# Patient Record
Sex: Male | Born: 1940 | Race: White | Hispanic: No | Marital: Married
Health system: Southern US, Community
[De-identification: ages and names within clinical notes are randomized; demographics above are authoritative.]

---

## 2009-12-30 ENCOUNTER — Ambulatory Visit: Payer: Self-pay | Admitting: Urology

## 2010-01-28 ENCOUNTER — Ambulatory Visit: Payer: Self-pay | Admitting: Radiation Oncology

## 2010-02-18 ENCOUNTER — Ambulatory Visit: Payer: Self-pay | Admitting: Radiation Oncology

## 2010-02-21 ENCOUNTER — Ambulatory Visit: Payer: Self-pay | Admitting: Radiation Oncology

## 2010-02-25 ENCOUNTER — Ambulatory Visit: Payer: Self-pay | Admitting: Radiation Oncology

## 2010-03-11 ENCOUNTER — Ambulatory Visit: Payer: Self-pay | Admitting: Radiation Oncology

## 2010-03-28 ENCOUNTER — Ambulatory Visit: Payer: Self-pay | Admitting: Radiation Oncology

## 2010-04-27 ENCOUNTER — Ambulatory Visit: Payer: Self-pay | Admitting: Radiation Oncology

## 2010-06-27 ENCOUNTER — Ambulatory Visit: Payer: Self-pay | Admitting: Radiation Oncology

## 2010-07-16 ENCOUNTER — Ambulatory Visit: Payer: Self-pay | Admitting: Radiation Oncology

## 2010-07-28 ENCOUNTER — Ambulatory Visit: Payer: Self-pay | Admitting: Radiation Oncology

## 2011-01-14 ENCOUNTER — Ambulatory Visit: Payer: Self-pay | Admitting: Radiation Oncology

## 2011-01-15 LAB — PSA

## 2011-01-28 ENCOUNTER — Ambulatory Visit: Payer: Self-pay | Admitting: Radiation Oncology

## 2011-09-17 IMAGING — CT CT SIM MISC
1 series · 16 of 32 positions shown, 20 images · non-contrast
Comparison: none

[Series 2: — · axial · 0.49mm/px · z∈[-1140,-1044]mm · 16 of 54 slices shown, 20 images]
[im 4/54  soft-tissue]
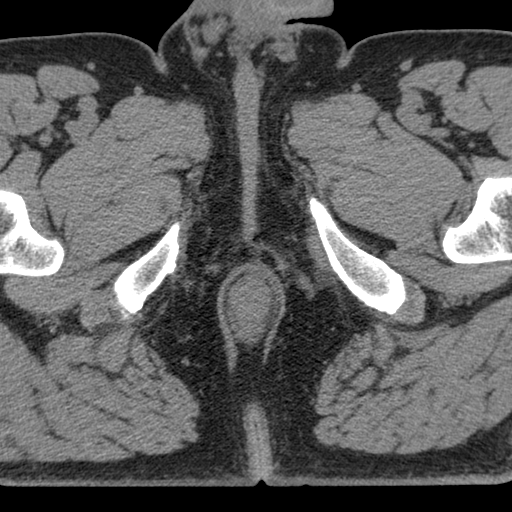
[im 4/54  bone]
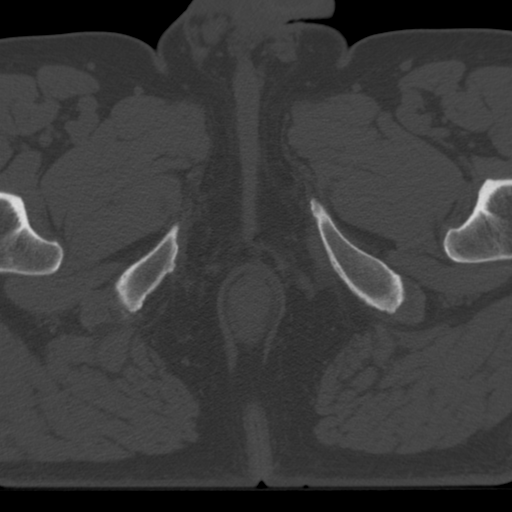
[im 7/54  soft-tissue]
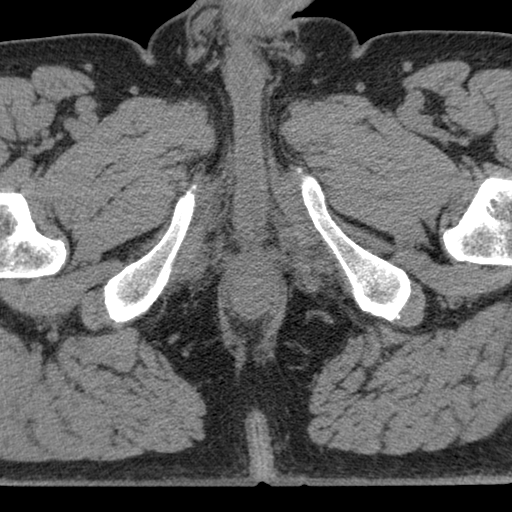
[im 11/54  soft-tissue]
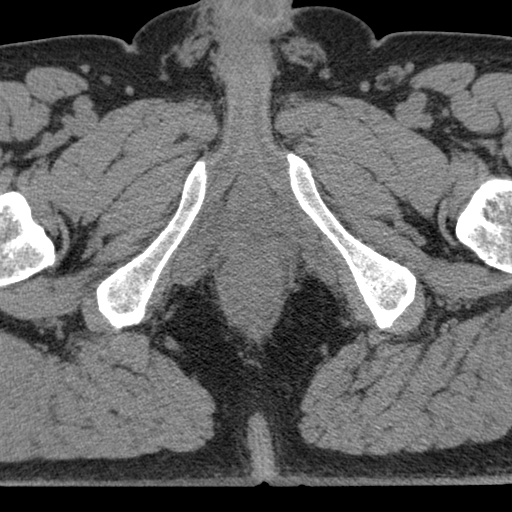
[im 14/54  soft-tissue]
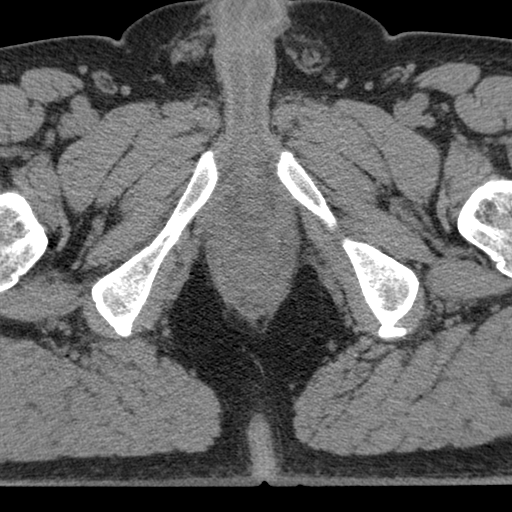
[im 18/54  soft-tissue]
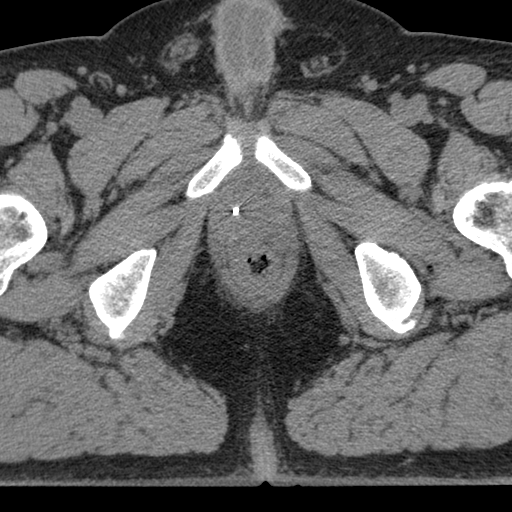
[im 21/54  soft-tissue]
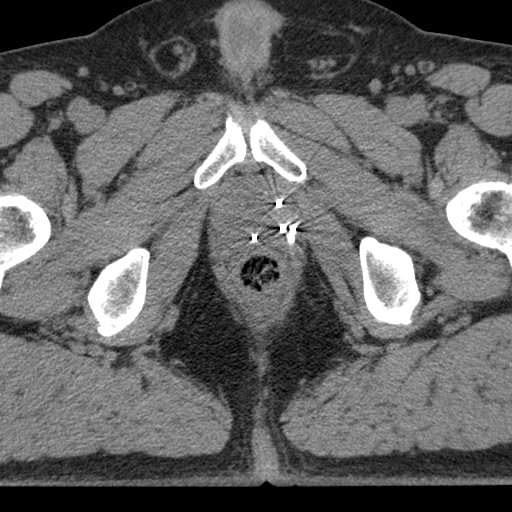
[im 24/54  soft-tissue]
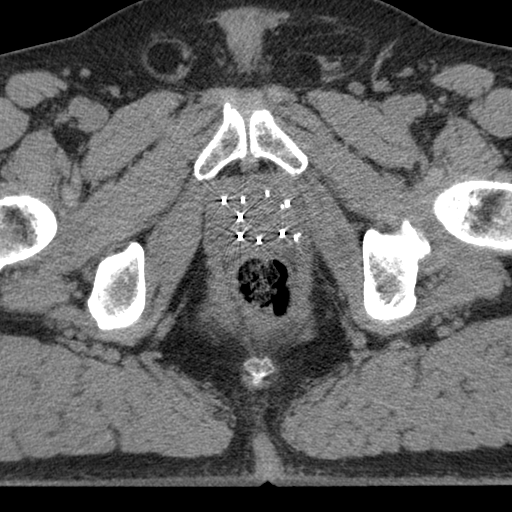
[im 30/54  soft-tissue]
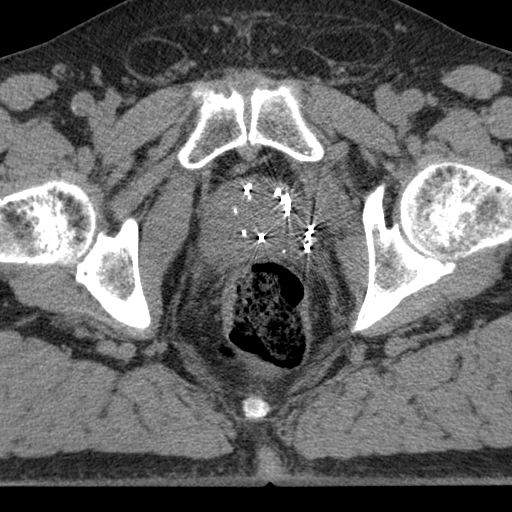
[im 33/54  soft-tissue]
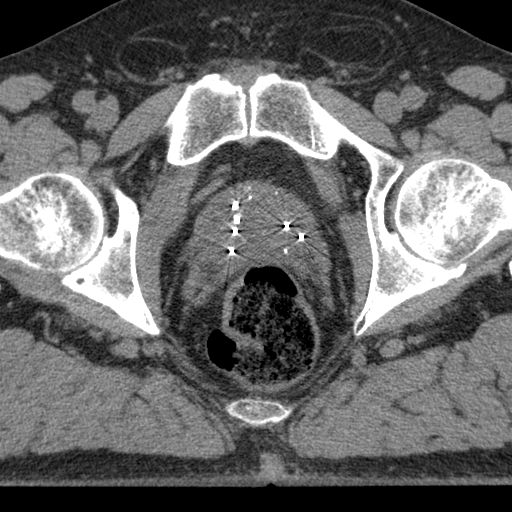
[im 33/54  bone]
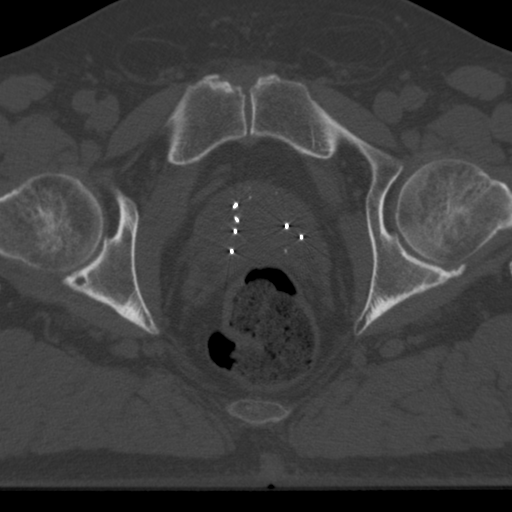
[im 36/54  soft-tissue]
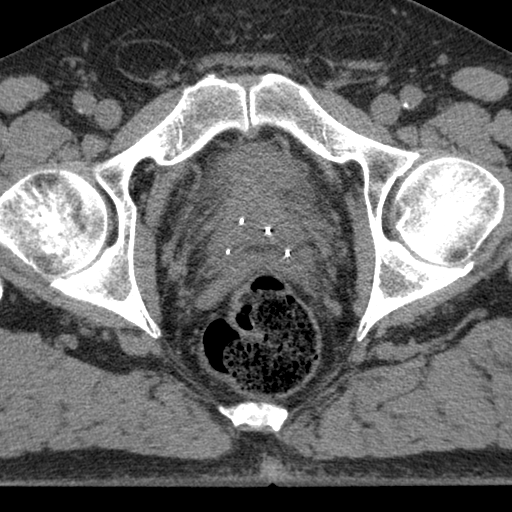
[im 40/54  soft-tissue]
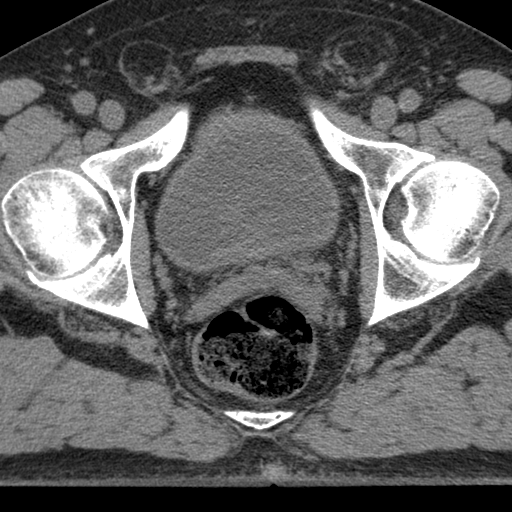
[im 43/54  soft-tissue]
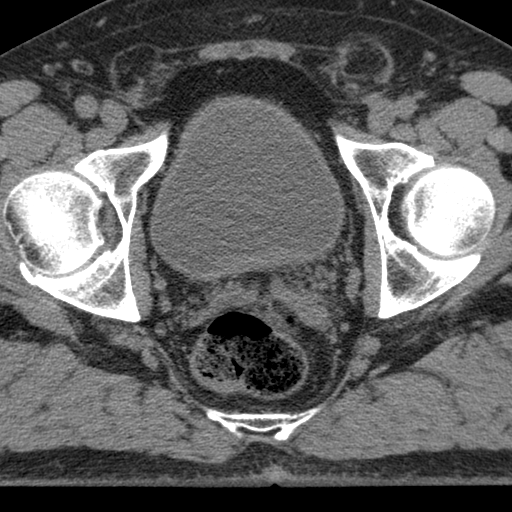
[im 47/54  soft-tissue]
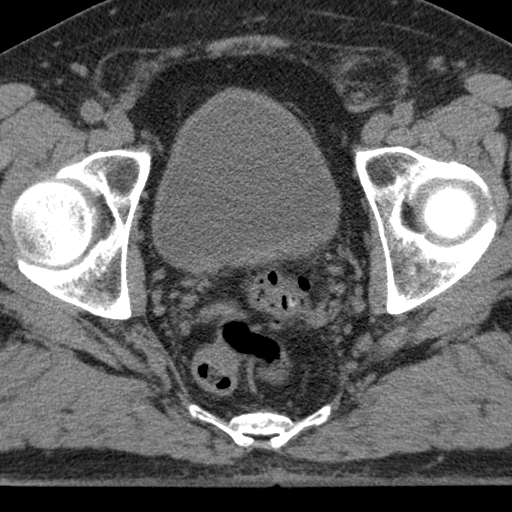
[im 47/54  lung]
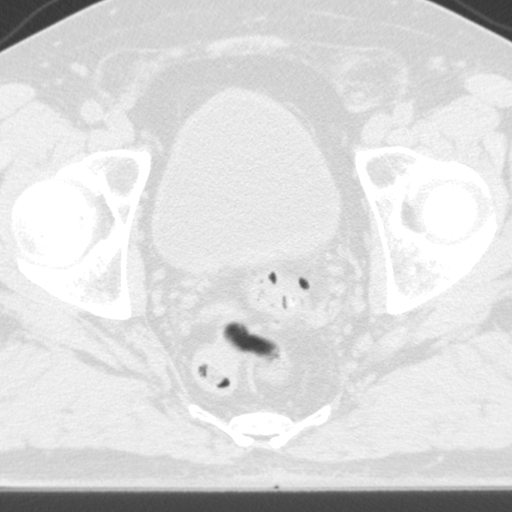
[im 48/54  lung]
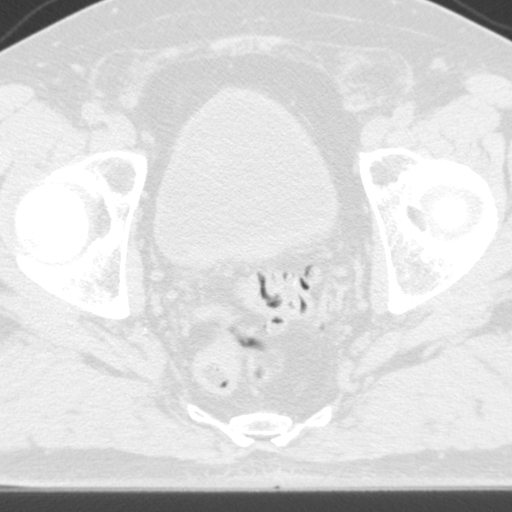
[im 50/54  soft-tissue]
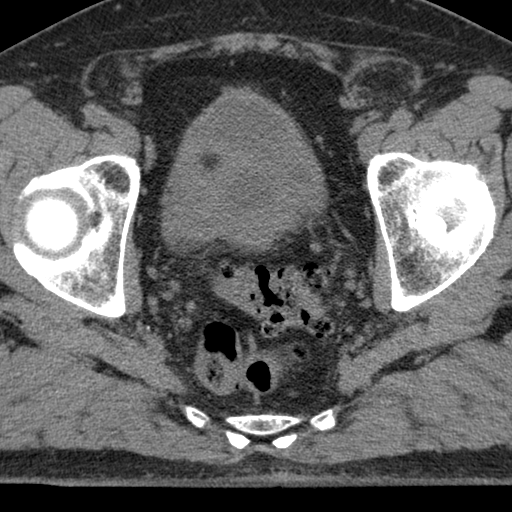
[im 50/54  lung]
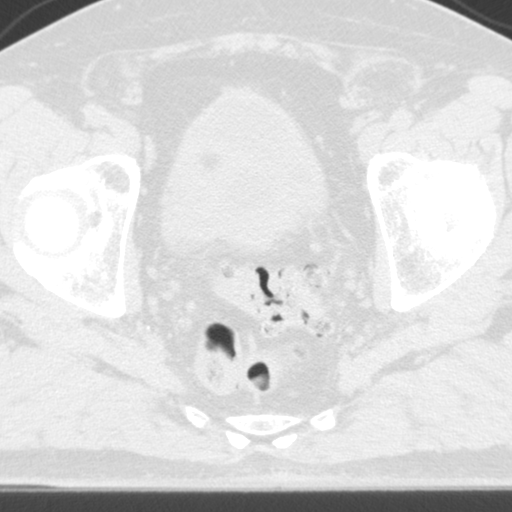
[im 52/54  lung]
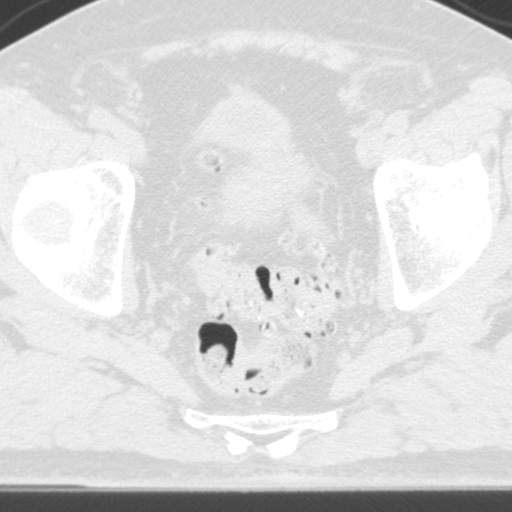

[16 of 32 positions shown; findings below may reference images not displayed]

IMAGES IMPORTED FROM THE [employer] WORKFLOW SYSTEM
NO DICTATION FOR STUDY

## 2013-01-17 ENCOUNTER — Ambulatory Visit: Payer: Self-pay | Admitting: Urology

## 2013-01-31 ENCOUNTER — Ambulatory Visit: Payer: Self-pay | Admitting: Urology

## 2015-04-19 NOTE — Op Note (Signed)
PATIENT NAME:  Devon DolphinREEDER, Renardo L MR#:  [id number] DATE OF BIRTH:  1941-09-08  DATE OF PROCEDURE:  01/31/2013  PREOPERATIVE DIAGNOSES:  1.  Left inguinal hernia.  2.  Right thigh mole.   POSTOPERATIVE DIAGNOSES: 1.  Left inguinal hernia.  2.  Right thigh mole.   PROCEDURE: Left inguinal herniorrhaphy and right thigh mole excision.   SURGEON: Assunta GamblesBrian Gaylan Fauver, M.D.   ANESTHESIA: Laryngeal mask airway anesthesia.   INDICATIONS: The patient is a 74 year old gentleman, who has noted to have a progressively enlarging left inguinal hernia. He also has an approximate 5 to 6 mm mole on the right inner thigh. He presents for hernia repair and mole removal.   DESCRIPTION OF PROCEDURE: After informed consent was obtained, the patient was taken to the operating room and placed in the supine position on the operating table under laryngeal mask airway anesthesia. The patient was then prepped and draped in the usual standard fashion. An approximately 5 cm incision was made over the left inguinal crease. The incision was continued down to expose the external inguinal ring and canal. The hernia could be visualized extending beyond the ring. A small incision was then made in the inguinal ring. It was opened in standard fashion both proximal and distal exposing the cord contents. These were encircled and elevated from the canal. The ilioinguinal nerve was easily identified. It was mobilized medially. A large amount of preperitoneal fat was noted on the anterior surface of the cord and cord contents. As the fat was dissected free from the vessels, the hernia sac was subsequently identified. It was dissected free from the preperitoneal fat and the cord contents back to the level of the internal ring. Numerous areas of thickened adhered tissue were encountered, which required sharp excision of the hernia sac from the surrounding tissue. The section of preperitoneal fat was noted to be of significant size. Several attempts were  made at passing the fat back into the peritoneal compartment; however, resistance was encountered. The decision was made to excise the preperitoneal fat. It was suture ligated utilizing a 0 silk suture ligature in 2 sections and then excised. No significant bleeding was encountered.   The inguinal hernia sac was noted to be fairly wide and thick. An area of fat and thickening was noted near its base. The sac was opened. Multiple adhesions were encountered. There was some omental fat, which was dissected free utilizing sharp dissection as the fat was pushed back in. The other area of thickening was noted to be the sigmoid colon. It was noted to be very adherent to the portion of the hernia sac; approximately 4 to 5 cm of bowel was within the proximal portion of the sac. Several diverticula were also visualized extending from the section of bowel. Care was taken to separate the bowel and diverticula from the hernia sac. The bulk of the bowel was able to be pushed back into the peritoneal cavity. The hernia sac was then twisted. It would normally be suture-ligated more proximal, but due to the close proximity of the bowel, the decision was made to place the suture LigaSure slightly more distal. The sac was very thick even at this level, although there was no evidence of bowel or fat at this site. The LigaSure with a 0 silk tie was placed. The remaining portion of the hernia sac was excised. The area was then pushed back into the abdominal compartment. A second area of fat was noted extending through the posterior wall of the  canal. This was also dissected free and separated from the surrounding tissue. It was advanced back into the abdominal compartment. The transverse layer was separated from the upper layer of tissue. A relaxing incision was made. The shelving edge was grasped utilizing Allis clamps. The ilioinguinal ligament was easily identified. Multiple sutures were placed approximately 3 to 4 mm apart from the  medial aspect of the shelving edge and the medial aspect of the symphysis along the ligament more lateral; approximately 16 sutures were utilized for complete closure of the hernia defect.   The small finger could still be inserted into the more lateral aspect. The ilioinguinal nerve was visualized throughout the closure. It was kept an adequate distance from the area of repair. Once the repair was completed, the cord contents were returned to the canal. The canal was then closed utilizing a running 3-0 Vicryl suture. The subcutaneous tissue was then closed utilizing a plain gut suture. The skin was closed utilizing a running 4-0 Vicryl subcuticular stitch. Steri-Strips, Telfa and Tegaderm dressing was applied. Approximately 10 mL of 0.5% Marcaine was injected along the incision site. The 5 to 6 mm mole on medial aspect of the right thigh was grasped utilizing forceps. The base was then excised utilizing electrocautery. The small remaining stump was cauterized level with the remaining skin. The hernia sac and mole will be sent to pathology for further evaluation. No significant bleeding was encountered. The blood loss was overall minimal. The patient was then awakened from laryngeal mask airway anesthesia. He was taken to the recovery room in stable condition. There were no problems or complications. The patient tolerated the procedure well.  ____________________________ Madolyn Frieze. Achilles Dunk, MD bsc:aw D: 01/31/2013 21:36:08 ET T: 02/01/2013 07:20:04 ET JOB#: [id number]  cc: Madolyn Frieze. Achilles Dunk, MD, <Dictator> Madolyn Frieze Fannie Gathright MD ELECTRONICALLY SIGNED 02/02/2013 7:45
